# Patient Record
Sex: Male | Born: 1995 | Race: White | Hispanic: No | Smoking: Never smoker
Health system: Southern US, Community
[De-identification: ages and names within clinical notes are randomized; demographics above are authoritative.]

---
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
## 2014-06-29 IMAGING — US US RENAL KIDNEY
1 series · 14 of 25 positions shown · non-contrast
Comparison: none

REASON FOR EXAM: right flank pain passed stone
COMMENTS:

PROCEDURE:     US  - US KIDNEY  - August 23, 2012  [DATE]
RESULT:     Bilateral renal ultrasound dated 08/23/2012

[Series 1: us renal kidney · 0.26mm/px · 14 of 30 slices shown]
[im 1/30]
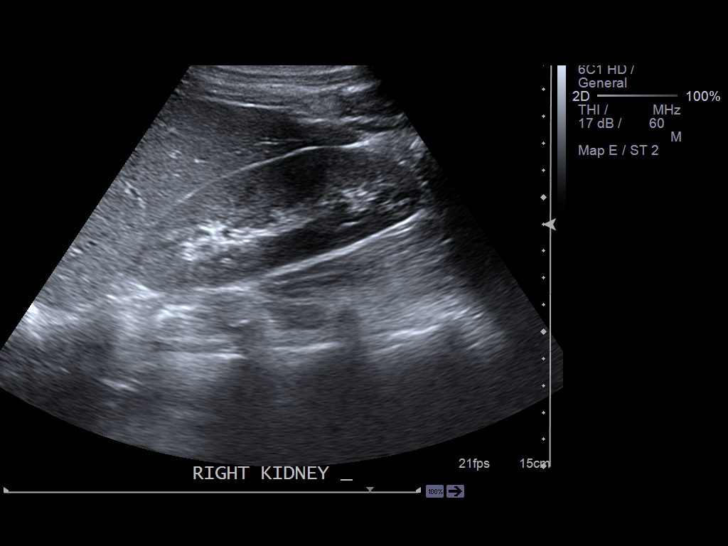
[im 3/30]
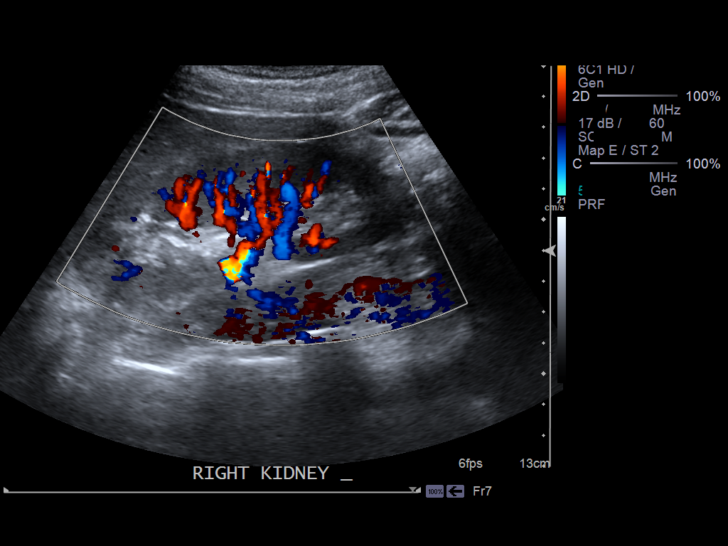
[im 5/30]
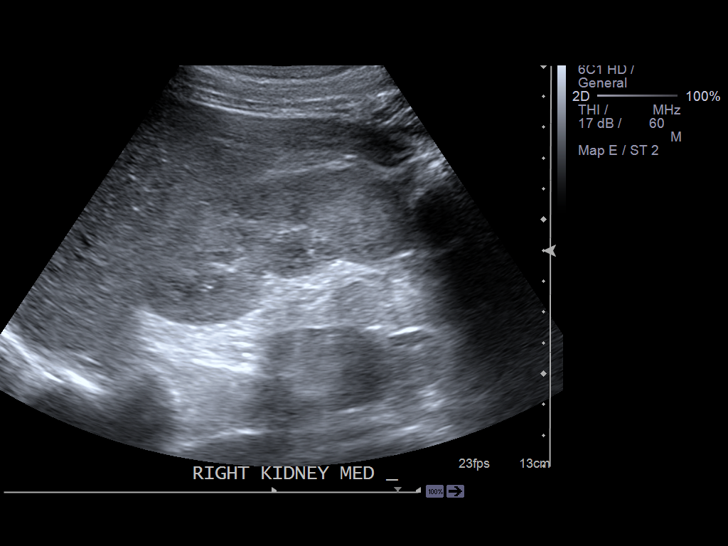
[im 8/30]
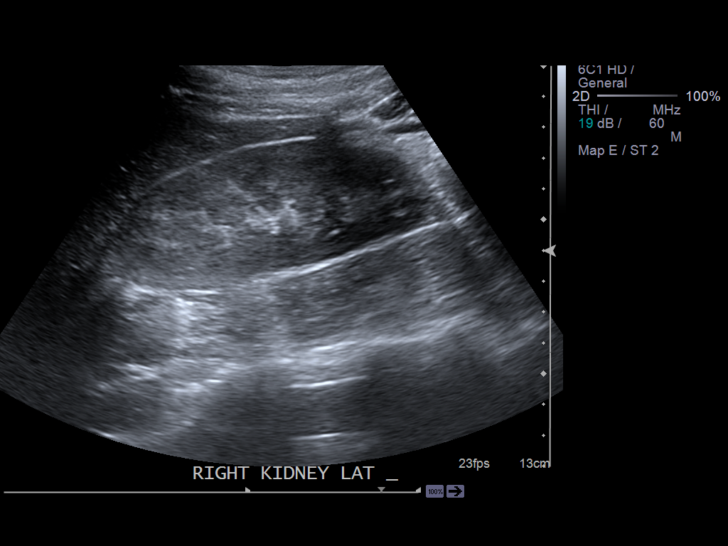
[im 10/30]
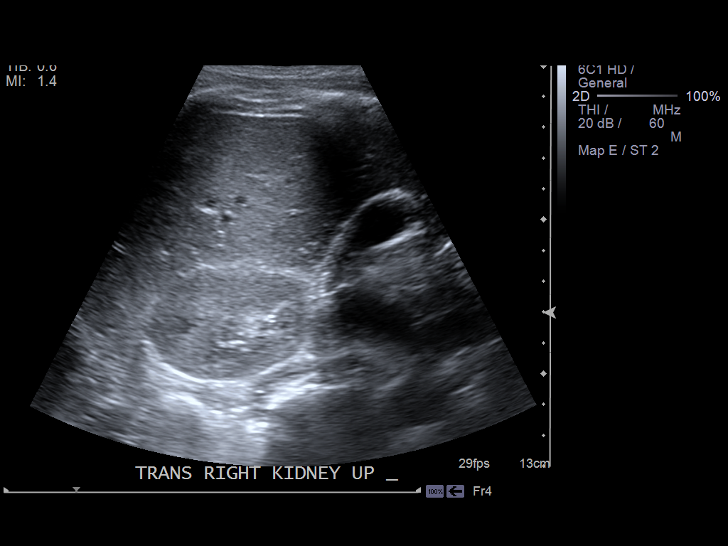
[im 11/30]
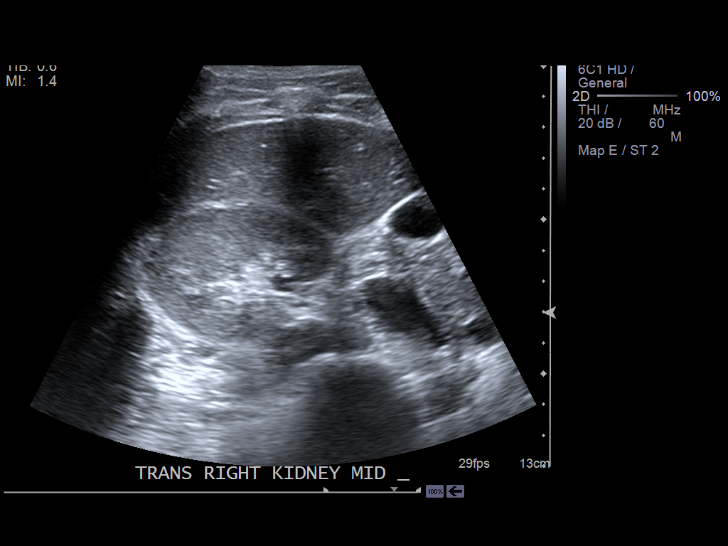
[im 14/30]
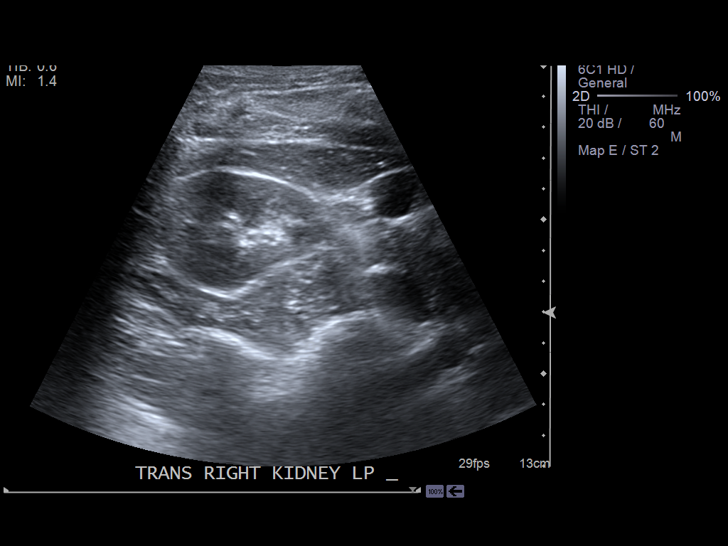
[im 16/30]
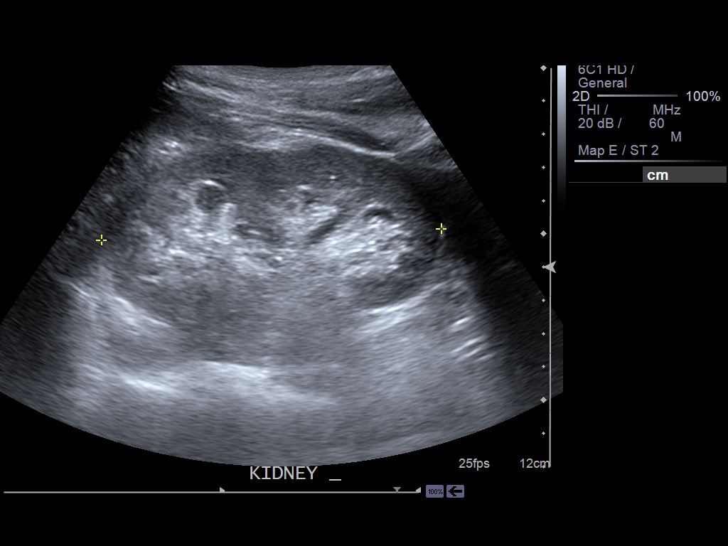
[im 19/30]
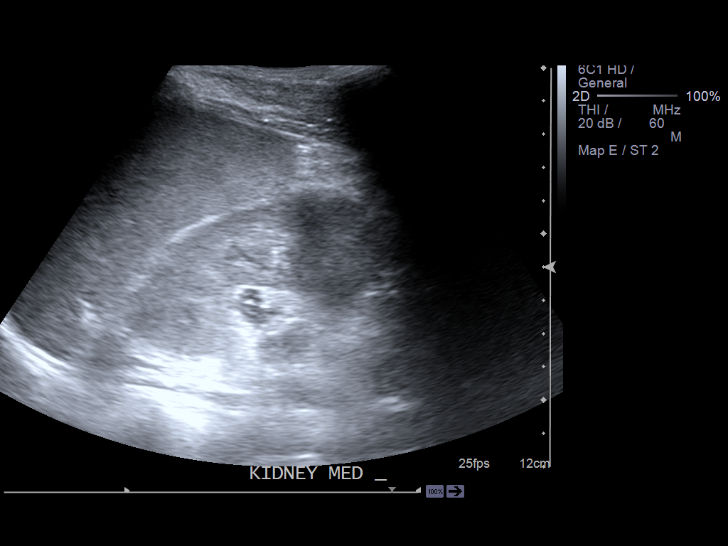
[im 20/30]
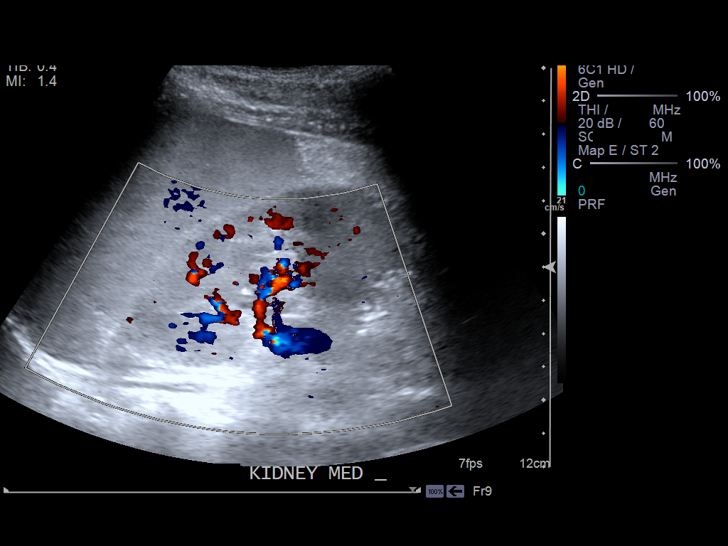
[im 22/30]
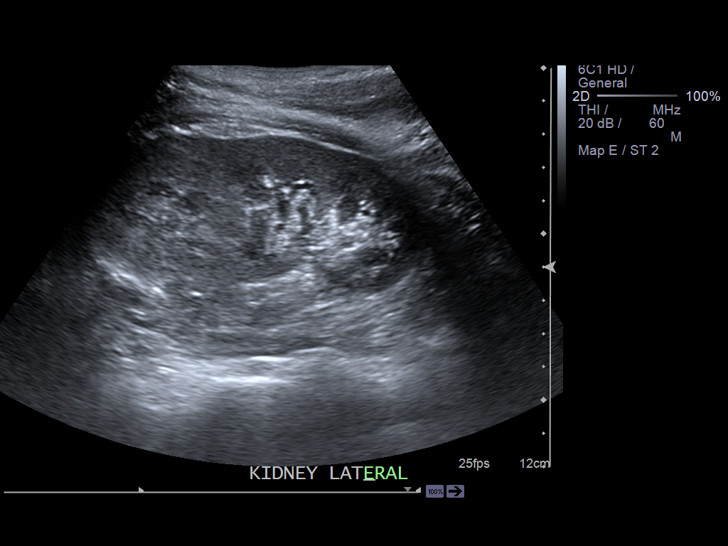
[im 25/30]
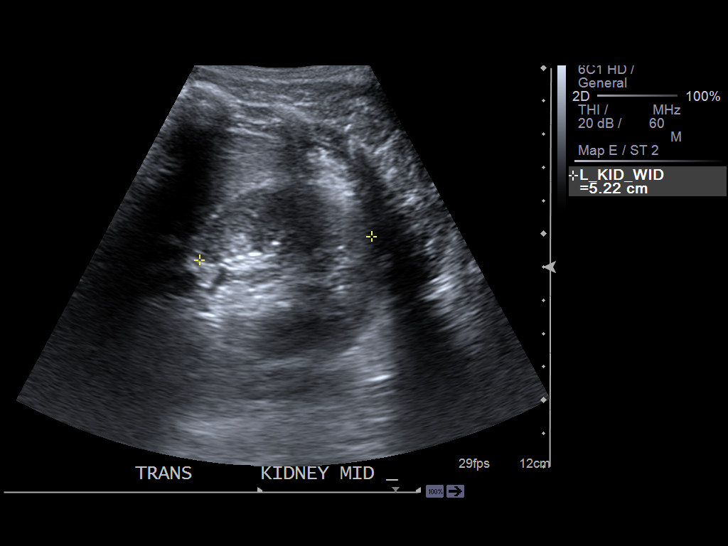
[im 27/30]
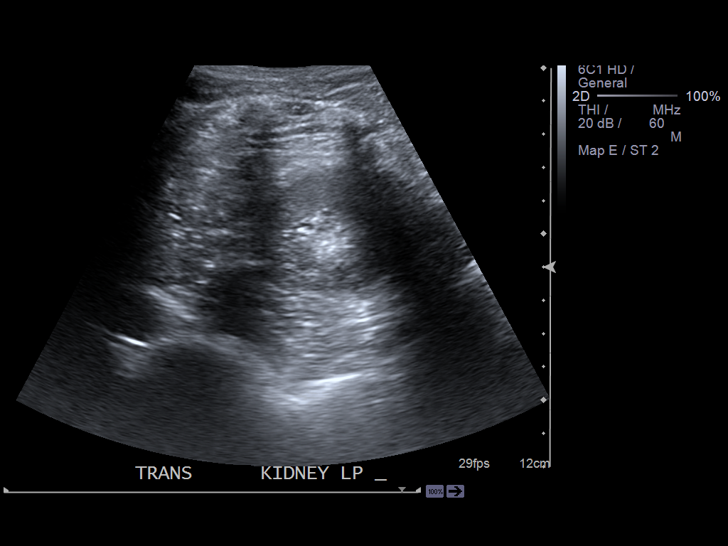
[im 30/30]
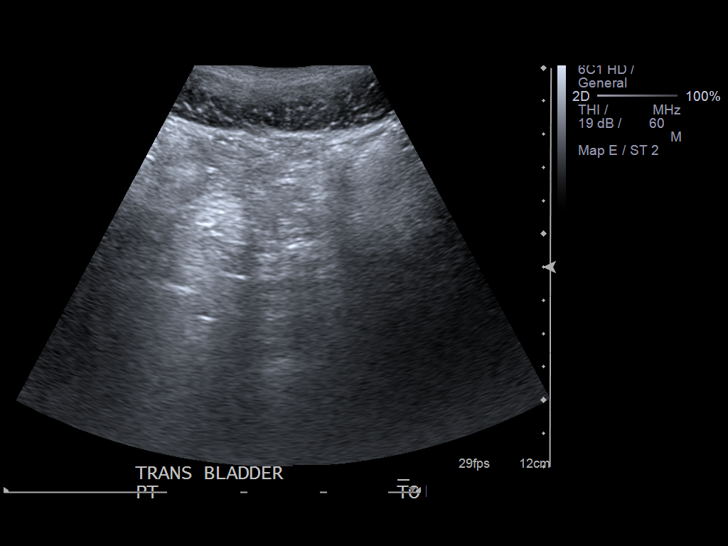

[14 of 25 positions shown; findings below may reference images not displayed]

FINDINGS: The right kidney measures 11.03 x 5.05 4.5 cm and the right
x 5.22 x 5.2 cm. There is appropriate cortical medullary differentiation
without evidence of hydronephrosis, solid or cystic masses, or calculi. The
urinary bladder is decompressed. The patient voided just prior to the exam
and also reports passage of a urinary calculus.
IMPRESSION: Unremarkable renal ultrasound.

## 2015-02-04 ENCOUNTER — Emergency Department
Admission: EM | Admit: 2015-02-04 | Discharge: 2015-02-04 | Disposition: A | Discharge status: Home / Self Care | Payer: BLUE CROSS/BLUE SHIELD | Attending: Emergency Medicine | Admitting: Emergency Medicine

## 2015-02-04 ENCOUNTER — Encounter: Payer: Self-pay | Admitting: Emergency Medicine

## 2015-02-04 ENCOUNTER — Emergency Department: Payer: BLUE CROSS/BLUE SHIELD

## 2015-02-04 DIAGNOSIS — N50819 Testicular pain, unspecified: Secondary | ICD-10-CM

## 2015-02-04 DIAGNOSIS — N433 Hydrocele, unspecified: Secondary | ICD-10-CM | POA: Diagnosis not present

## 2015-02-04 DIAGNOSIS — I861 Scrotal varices: Secondary | ICD-10-CM | POA: Diagnosis not present

## 2015-02-04 DIAGNOSIS — R1030 Lower abdominal pain, unspecified: Secondary | ICD-10-CM | POA: Diagnosis present

## 2015-02-04 LAB — URINALYSIS COMPLETE WITH MICROSCOPIC (ARMC ONLY)
BILIRUBIN URINE: NEGATIVE
Bacteria, UA: NONE SEEN
GLUCOSE, UA: NEGATIVE mg/dL
HGB URINE DIPSTICK: NEGATIVE
KETONES UR: NEGATIVE mg/dL
LEUKOCYTES UA: NEGATIVE
NITRITE: NEGATIVE
Protein, ur: NEGATIVE mg/dL
RBC / HPF: NONE SEEN RBC/hpf (ref 0–5)
Specific Gravity, Urine: 1.035 — ABNORMAL HIGH (ref 1.005–1.030)
Squamous Epithelial / LPF: NONE SEEN
pH: 6 (ref 5.0–8.0)

## 2015-02-04 MED ORDER — IBUPROFEN 800 MG PO TABS: 800.0000 mg | ORAL_TABLET | Freq: Three times a day (TID) | ORAL | Status: AC | PRN

## 2015-02-04 NOTE — ED Notes (Signed)
Pt presents with right side testicle tenderness for a couple of days.

## 2015-02-04 NOTE — ED Provider Notes (Signed)
CSN: [id number]     Arrival date & time 02/04/15  1624 History   First MD Initiated Contact with Patient 02/04/15 1833     Chief Complaint  Patient presents with  . Groin Pain     (Consider location/radiation/quality/duration/timing/severity/associated sxs/prior Treatment) HPI  20 year old male presents to emergency department for evaluation of scrotal pain, right greater than left. Patient was playing basketball 2 days ago when he noticed worsening scrotal pain, right greater than left side. Patient's pain is progressive the last 2 days. He has continued to perform physical activity such as playing the drums. Pain is increased with physical activity, he gets relief with lying flat. He denies any trauma, fevers, urinary symptoms, abdominal pain, penal discharge. He has tried Tylenol today with minimal relief. Pain is 4 out of 10 with activity.  History reviewed. No pertinent past medical history. History reviewed. No pertinent past surgical history. No family history on file. Social History  Substance Use Topics  . Smoking status: Never Smoker   . Smokeless tobacco: None  . Alcohol Use: No    Review of Systems  Constitutional: Negative.  Negative for fever, chills, activity change and appetite change.  HENT: Negative for congestion, ear pain, mouth sores, rhinorrhea, sinus pressure, sore throat and trouble swallowing.   Eyes: Negative for photophobia, pain and discharge.  Respiratory: Negative for cough, chest tightness and shortness of breath.   Cardiovascular: Negative for chest pain and leg swelling.  Gastrointestinal: Negative for nausea, vomiting, abdominal pain, diarrhea and abdominal distention.  Genitourinary: Positive for testicular pain (right greater than left). Negative for dysuria, hematuria, flank pain, discharge, scrotal swelling and difficulty urinating.  Musculoskeletal: Negative for back pain, arthralgias and gait problem.  Skin: Negative for color change and rash.   Neurological: Negative for dizziness and headaches.  Hematological: Negative for adenopathy.  Psychiatric/Behavioral: Negative for behavioral problems and agitation.      Allergies  Review of patient's allergies indicates no known allergies.  Home Medications   Prior to Admission medications   Medication Sig Start Date End Date Taking? Authorizing Provider  ibuprofen (ADVIL,MOTRIN) 800 MG tablet Take 1 tablet (800 mg total) by mouth every 8 (eight) hours as needed. 02/04/15   Evon Slack, PA-C   BP 128/69 mmHg  Pulse 82  Temp(Src) 98.1 F (36.7 C) (Oral)  Resp 18  Ht 5\' 11"  (1.803 m)  Wt 63.504 kg  BMI 19.53 kg/m2  SpO2 100% Physical Exam  Constitutional: He is oriented to person, place, and time. He appears well-developed and well-nourished.  HENT:  Head: Normocephalic and atraumatic.  Eyes: Conjunctivae and EOM are normal. Pupils are equal, round, and reactive to light.  Neck: Normal range of motion. Neck supple.  Cardiovascular: Normal rate and intact distal pulses.   Pulmonary/Chest: Effort normal. No respiratory distress.  Abdominal: Soft. Bowel sounds are normal. He exhibits no distension. There is no tenderness.  Genitourinary: Right testis shows tenderness (Minimal tenderness to palpation just superior to testicle). Right testis shows no mass and no swelling (small varicocele palpable). Left testis shows mass (soft tissue mass just above the testicle) and tenderness. Left testis shows no swelling (small varicocele palpable). Circumcised. No penile tenderness. No discharge found.  No significant fluid accumulation throughout the scrotum. Scrotum is nontender. Testicles are nontender. Small palpable soft tissue structure superior to left testicle is mildly tender to palpation. There is no scrotal warmth, erythema.  Musculoskeletal: Normal range of motion. He exhibits no edema or tenderness.  Lymphadenopathy:  Right: No inguinal adenopathy present.       Left: No  inguinal adenopathy present.  Neurological: He is alert and oriented to person, place, and time.  Skin: Skin is warm and dry.  Psychiatric: He has a normal mood and affect. His behavior is normal. Judgment and thought content normal.    ED Course  Procedures (including critical care time) Labs Review Labs Reviewed  URINALYSIS COMPLETEWITH MICROSCOPIC ([facility] ONLY) - Abnormal; Notable for the following:    Color, Urine YELLOW (*)    APPearance CLEAR (*)    Specific Gravity, Urine 1.035 (*)    All other components within normal limits    Imaging Review [place] Scrotum  02/04/2015  CLINICAL DATA:  Testicular pain, RIGHT greater than LEFT for 1 day. EXAM: ULTRASOUND OF SCROTUM TECHNIQUE: Complete ultrasound examination of the testicles, epididymis, and other scrotal structures was performed. COMPARISON:  None. FINDINGS: Right testicle Measurements: Normal in size and homogeneous echotexture measuring 4.5 x 2.1 x 3.0 cm. Normal color Doppler flow. Left testicle Measurements: Normal size and homogeneous echotexture measuring 4.8 x 1.8 x 2.8 cm. Normal color Doppler flow. Right epididymis:  Normal in size and appearance. Left epididymis:  Normal in size and appearance. Hydrocele:  Small LEFT hydrocele Varicocele:  Small LEFT varicocele. IMPRESSION: 1. Normal testicles with normal arterial and venous flow. 2. Small hydrocele Electronically Signed   By: Genevive BiStewart  Edmunds M.D.   On: 02/04/2015 17:24   [place] Art/ven Flow Abd Pelv Doppler  02/04/2015  CLINICAL DATA:  Testicular pain, RIGHT greater than LEFT for 1 day. EXAM: ULTRASOUND OF SCROTUM TECHNIQUE: Complete ultrasound examination of the testicles, epididymis, and other scrotal structures was performed. COMPARISON:  None. FINDINGS: Right testicle Measurements: Normal in size and homogeneous echotexture measuring 4.5 x 2.1 x 3.0 cm. Normal color Doppler flow. Left testicle Measurements: Normal size and homogeneous echotexture measuring 4.8 x 1.8 x 2.8 cm.  Normal color Doppler flow. Right epididymis:  Normal in size and appearance. Left epididymis:  Normal in size and appearance. Hydrocele:  Small LEFT hydrocele Varicocele:  Small LEFT varicocele. IMPRESSION: 1. Normal testicles with normal arterial and venous flow. 2. Small hydrocele Electronically Signed   By: Genevive BiStewart  Edmunds M.D.   On: 02/04/2015 17:24   I have personally reviewed and evaluated these images and lab results as part of my medical decision-making.   EKG Interpretation None      MDM   Final diagnoses:  Hydrocele in adult  Left varicocele    20 year old male with right greater than left scrotal pain after playing basketball 2 days ago. He denies any fevers, warmth, penal discharge. Pain increased with physical activity today. He gets relief with rest. Urinalysis negative for infection. Ultrasound showed no evidence of torsion, epididymitis. There was evidence of small left hydrocele and varicocele. Patient with no signs of infection on exam. She will start ibuprofen 800 mg 3 times a day. Educated on scrotal support. He will follow-up with urologist in 2-3 days for repeat evaluation. He'll return to the ER for any increased pain worsening symptoms or urgent changes in his health.    Evon Slackhomas C Birdell Frasier, PA-C 02/04/15 1934  Darien Ramusavid W Kaminski, MD 02/04/15 2312

## 2015-02-04 NOTE — ED Notes (Signed)
Patient transported to Ultrasound 

## 2015-02-04 NOTE — Discharge Instructions (Signed)
Hydrocele, Adult  A hydrocele is a collection of fluid in the loose pouch of skin that holds the testicles (scrotum). Usually, it affects only one testicle.  CAUSES  This condition may be caused by:   An injury to the scrotum.   An infection.   A tumor or cancer of the testicle.   Twisting of a testicle.   Decreased blood flow to the scrotum.  SYMPTOMS  A hydrocele feels like a water-filled balloon. It may also feel heavy. A hydrocele can cause:   Swelling of the scrotum. The swelling may decrease when you lie down.   Swelling of the groin.   Mild discomfort in the scrotum.   Pain. This can develop if the hydrocele was caused by infection or twisting.  DIAGNOSIS  This condition may be diagnosed with a medical history, physical exam, and imaging tests. You may also have blood and urine tests to check for infection.  TREATMENT  Treatment may include:   Watching and waiting, particularly if the hydrocele causes no symptoms.   Treatment of the underlying condition. This may include using antibiotic medicine.   Surgery to drain the fluid. Some surgical options include:   Needle aspiration. For this procedure, a needle is used to drain fluid.   Hydrocelectomy. For this procedure, an incision is made in the scrotum to remove the fluid sac.  HOME CARE INSTRUCTIONS   Keep all follow-up visits as told by your health care provider. This is important.   Watch the hydrocele for any changes.   Take over-the-counter and prescription medicines only as told by your health care provider.   If you were prescribed an antibiotic medicine, use it as told by your health care provider. Do not stop using the antibiotic even if your condition improves.  SEEK MEDICAL CARE IF:   The swelling in your scrotum or groin gets worse.   The hydrocele becomes red, firm, tender to the touch, or painful.   You notice any changes in the hydrocele.   You have a fever.     This information is not intended to replace advice given to  you by your health care provider. Make sure you discuss any questions you have with your health care provider.     Document Released: 06/28/2009 Document Revised: 05/25/2014 Document Reviewed: 01/04/2014  Elsevier Interactive Patient Education 2016 [employer]  Varicocele  A varicocele is a swelling of veins in the scrotum. The scrotum is the sac that contains the testicles. Varicoceles can occur on either side of the scrotum, but they are more common on the left side. They occur most often in teenage boys and young men.  In most cases, varicoceles are not a serious problem. They are usually small and painless and do not require treatment. Tests may be done to confirm the diagnosis. Treatment may be needed if:   A varicocele is large, causes a lot of pain, or causes pain when exercising.   Varicoceles are found on both sides of the scrotum.   The testicle on the opposite side is absent or not normal.   A varicocele causes a decrease in the size of the testicle in a growing adolescent.   The person has fertility problems.  CAUSES  This condition is the result of valves in the veins not working properly. Valves in the veins help to return blood from the scrotum and testicles to the heart. If these valves do not work well, blood flows backward and backs up into the   veins, which causes the veins to swell. This is similar to what happens when varicose veins form in the leg.  SYMPTOMS  Most varicoceles do not cause any symptoms. If symptoms do occur, they may include:   Swelling on one side of the scrotum. The swelling may be more obvious when you are standing up.   A lumpy feeling in the scrotum.   A heavy feeling on one side of the scrotum.   A dull ache in the scrotum, especially after exercise or prolonged standing or sitting.   Slower growth or reduced size of the testicle on the side of the varicocele (in young males).   Problems with fertility. These can occur if the testicle does not grow  normally.  DIAGNOSIS  This condition may be diagnosed with a physical exam. You may also have an imaging test, called an ultrasound, to confirm the diagnosis and to help rule out other causes of the swelling.  TREATMENT  Treatment is usually not needed for this condition. If you have any pain, your health care provider may prescribe or recommend medicine to help relieve it. You may need regular exams so your health care provider can monitor the varicocele to ensure that it does not cause problems. When further treatment is needed, it may involve one of these options:   Varicocelectomy. This is a surgery in which the swollen veins are tied off so that the flow of blood goes to other veins instead.   Embolization. In this procedure, a small tube (catheter) is used to place metal coils or other blocking items in the veins. This cuts off the blood flow to the swollen veins.  HOME CARE INSTRUCTIONS   Take medicines only as directed by your health care provider.   Wear supportive underwear.   Use an athletic supporter for sports.   Keep all follow-up visits as directed by your health care provider. This is important.  SEEK MEDICAL CARE IF:   Your pain is increasing.   You have redness in the affected area.   You have swelling that does not decrease when you are lying down.   One of your testicles is smaller than the other.   Your testicle becomes enlarged, swollen, or painful.     This information is not intended to replace advice given to you by your health care provider. Make sure you discuss any questions you have with your health care provider.     Document Released: 04/16/2000 Document Revised: 05/25/2014 Document Reviewed: 12/16/2013  Elsevier Interactive Patient Education 2016 [employer]
